# Patient Record
Sex: Male | Born: 1985 | Hispanic: No | Marital: Single | Smoking: Never smoker
Health system: Southern US, Community
[De-identification: ages and names within clinical notes are randomized; demographics above are authoritative.]

## PROBLEM LIST (undated history)

## (undated) DIAGNOSIS — F909 Attention-deficit hyperactivity disorder, unspecified type: Secondary | ICD-10-CM

## (undated) DIAGNOSIS — F419 Anxiety disorder, unspecified: Secondary | ICD-10-CM

---

## 2008-01-18 ENCOUNTER — Ambulatory Visit: Payer: Self-pay | Admitting: *Deleted

## 2008-01-25 ENCOUNTER — Ambulatory Visit: Payer: Self-pay | Admitting: *Deleted

## 2008-01-29 ENCOUNTER — Ambulatory Visit: Payer: Self-pay | Admitting: *Deleted

## 2008-04-14 ENCOUNTER — Ambulatory Visit: Payer: Self-pay | Admitting: Pediatrics

## 2008-07-25 ENCOUNTER — Ambulatory Visit: Payer: Self-pay | Admitting: Pediatrics

## 2012-02-23 ENCOUNTER — Emergency Department (HOSPITAL_BASED_OUTPATIENT_CLINIC_OR_DEPARTMENT_OTHER)
Admission: EM | Admit: 2012-02-23 | Discharge: 2012-02-23 | Disposition: A | Discharge status: Home / Self Care | Payer: BC Managed Care – PPO | Attending: Emergency Medicine | Admitting: Emergency Medicine

## 2012-02-23 ENCOUNTER — Encounter (HOSPITAL_BASED_OUTPATIENT_CLINIC_OR_DEPARTMENT_OTHER): Payer: Self-pay | Admitting: *Deleted

## 2012-02-23 DIAGNOSIS — S0180XA Unspecified open wound of other part of head, initial encounter: Secondary | ICD-10-CM | POA: Insufficient documentation

## 2012-02-23 DIAGNOSIS — W2203XA Walked into furniture, initial encounter: Secondary | ICD-10-CM | POA: Insufficient documentation

## 2012-02-23 DIAGNOSIS — S0181XA Laceration without foreign body of other part of head, initial encounter: Secondary | ICD-10-CM

## 2012-02-23 DIAGNOSIS — F909 Attention-deficit hyperactivity disorder, unspecified type: Secondary | ICD-10-CM | POA: Insufficient documentation

## 2012-02-23 HISTORY — DX: Anxiety disorder, unspecified: F41.9

## 2012-02-23 HISTORY — DX: Attention-deficit hyperactivity disorder, unspecified type: F90.9

## 2012-02-23 NOTE — ED Notes (Signed)
Pt. States he hit his head on the nightstand. Vertical 1/2 inch laceration noted above his right eye. Appears to be superficial. No bleeding noted. Denies loc.

## 2012-02-23 NOTE — ED Notes (Signed)
Patient here with small superficial laceration to right inner eyebrow, no bleeding to same after hitting corner of night stand

## 2012-02-23 NOTE — ED Provider Notes (Signed)
History     CSN: [id number]  Arrival date & time 02/23/12  4132   First MD Initiated Contact with Patient 02/23/12 0101      Chief Complaint  Patient presents with  . Facial Laceration    (Consider location/radiation/quality/duration/timing/severity/associated sxs/prior treatment) Patient is a 26 y.o. male presenting with skin laceration. The history is provided by the patient. No language interpreter was used.  Laceration  The incident occurred less than 1 hour ago. The laceration is located on the face. Size: 9 mm. Injury mechanism: corner of a night stand. The pain is mild. The pain has been constant since onset. He reports no foreign bodies present.    Past Medical History  Diagnosis Date  . Anxiety   . ADHD (attention deficit hyperactivity disorder)     History reviewed. No pertinent past surgical history.  No family history on file.  History  Substance Use Topics  . Smoking status: Never Smoker   . Smokeless tobacco: Not on file  . Alcohol Use: No      Review of Systems  All other systems reviewed and are negative.    Allergies  Strattera  Home Medications  No current outpatient prescriptions on file.  BP 128/78  Pulse 83  Temp 98 F (36.7 C) (Oral)  Resp 18  Ht 5\' 6"  (1.676 m)  Wt 120 lb (54.432 kg)  BMI 19.37 kg/m2  SpO2 98%  Physical Exam  Constitutional: He is oriented to person, place, and time. He appears well-developed and well-nourished. No distress.  HENT:  Head: Normocephalic.    Mouth/Throat: Oropharynx is clear and moist.  Eyes: Conjunctivae are normal. Pupils are equal, round, and reactive to light.  Neck: Normal range of motion. Neck supple.  Cardiovascular: Normal rate and regular rhythm.   Pulmonary/Chest: Effort normal and breath sounds normal. He has no wheezes. He has no rales.  Abdominal: Soft. Bowel sounds are normal.  Musculoskeletal: Normal range of motion.  Neurological: He is alert and oriented to person, place,  and time.  Skin: Skin is warm and dry.  Psychiatric: He has a normal mood and affect.    ED Course  Procedures (including critical care time)  Labs Reviewed - No data to display No results found.   No diagnosis found.    MDM  Follow up with your family doctor     LACERATION REPAIR Performed by: Jasmine Awe Authorized by: Jasmine Awe Consent: Verbal consent obtained. Risks and benefits: risks, benefits and alternatives were discussed Consent given by: patient Patient identity confirmed: provided demographic data Prepped and Draped in normal sterile fashion Wound explored  Laceration Location: face  Laceration Length: .9 cm  No Foreign Bodies seen or palpated  Anesthesia: local infiltration  Skin closure:dermabond  Patient tolerance: Patient tolerated the procedure well with no immediate complications.   Jasmine Awe, MD 02/23/12 [phone]

## 2017-10-21 DIAGNOSIS — K625 Hemorrhage of anus and rectum: Secondary | ICD-10-CM | POA: Diagnosis not present

## 2017-11-07 DIAGNOSIS — Z0000 Encounter for general adult medical examination without abnormal findings: Secondary | ICD-10-CM | POA: Diagnosis not present

## 2017-11-07 DIAGNOSIS — R946 Abnormal results of thyroid function studies: Secondary | ICD-10-CM | POA: Diagnosis not present

## 2017-11-07 DIAGNOSIS — Z23 Encounter for immunization: Secondary | ICD-10-CM | POA: Diagnosis not present

## 2019-01-25 ENCOUNTER — Ambulatory Visit
Admission: RE | Admit: 2019-01-25 | Discharge: 2019-01-25 | Disposition: A | Discharge status: Home / Self Care | Payer: No Typology Code available for payment source | Source: Ambulatory Visit | Attending: Occupational Medicine | Admitting: Occupational Medicine

## 2019-01-25 ENCOUNTER — Other Ambulatory Visit: Payer: Self-pay | Admitting: Occupational Medicine

## 2019-01-25 DIAGNOSIS — M25531 Pain in right wrist: Secondary | ICD-10-CM

## 2020-02-14 IMAGING — CR RIGHT WRIST - COMPLETE 3+ VIEW
4 series · 4 of 4 positions shown · non-contrast
Comparison: None.

CLINICAL DATA: Right wrist pain for 1 month.  No known injury.

EXAM:
RIGHT WRIST - COMPLETE 3+ VIEW

[x wrist pa right]
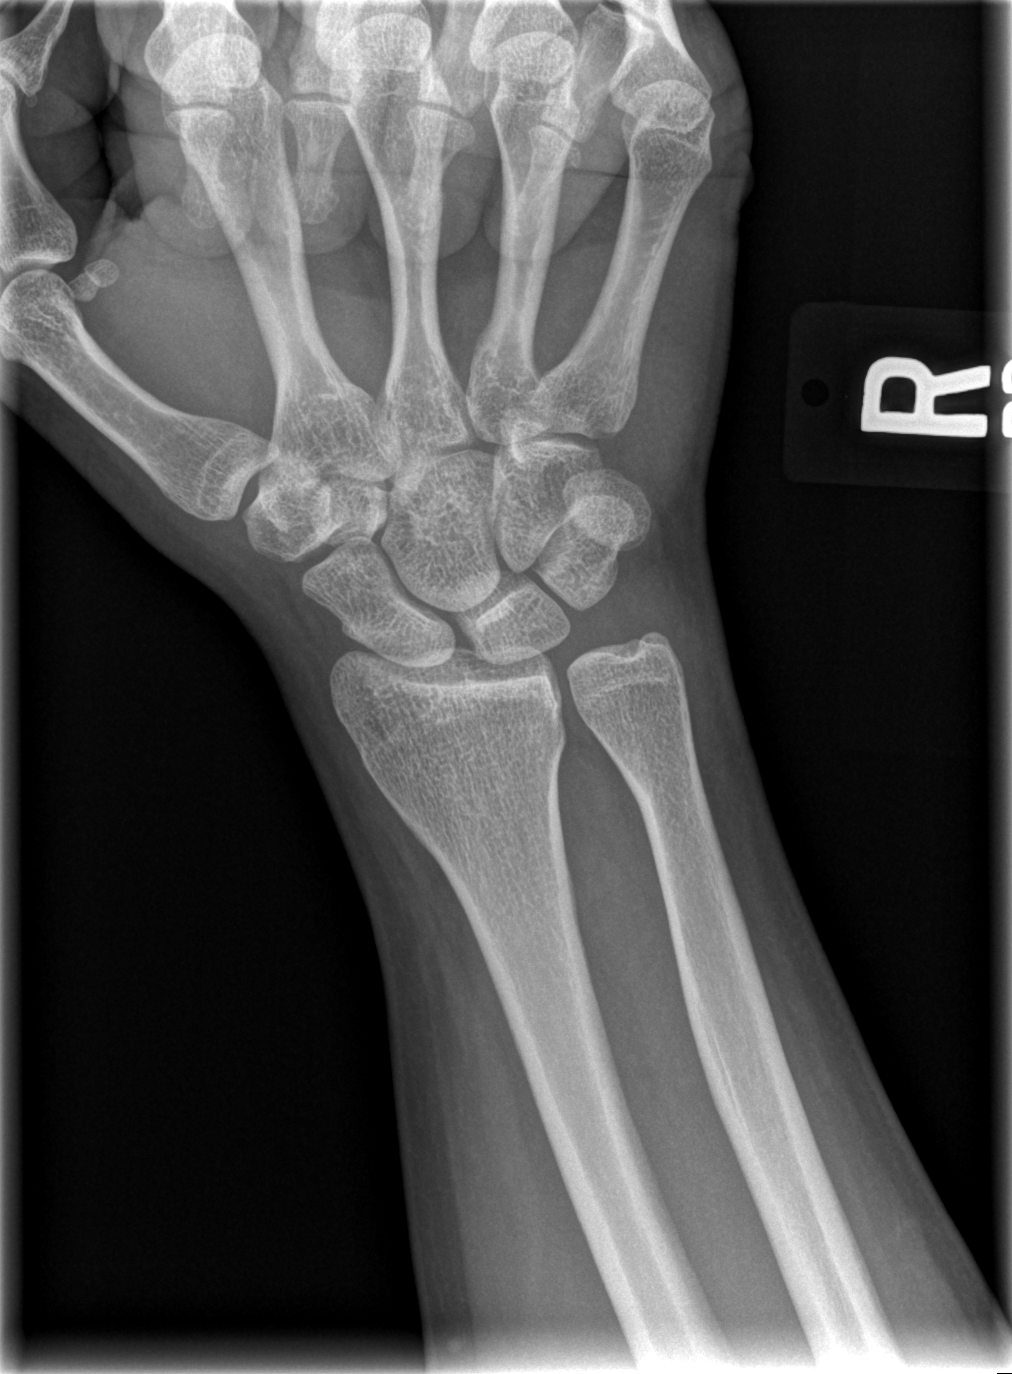

[x wrist obl right]
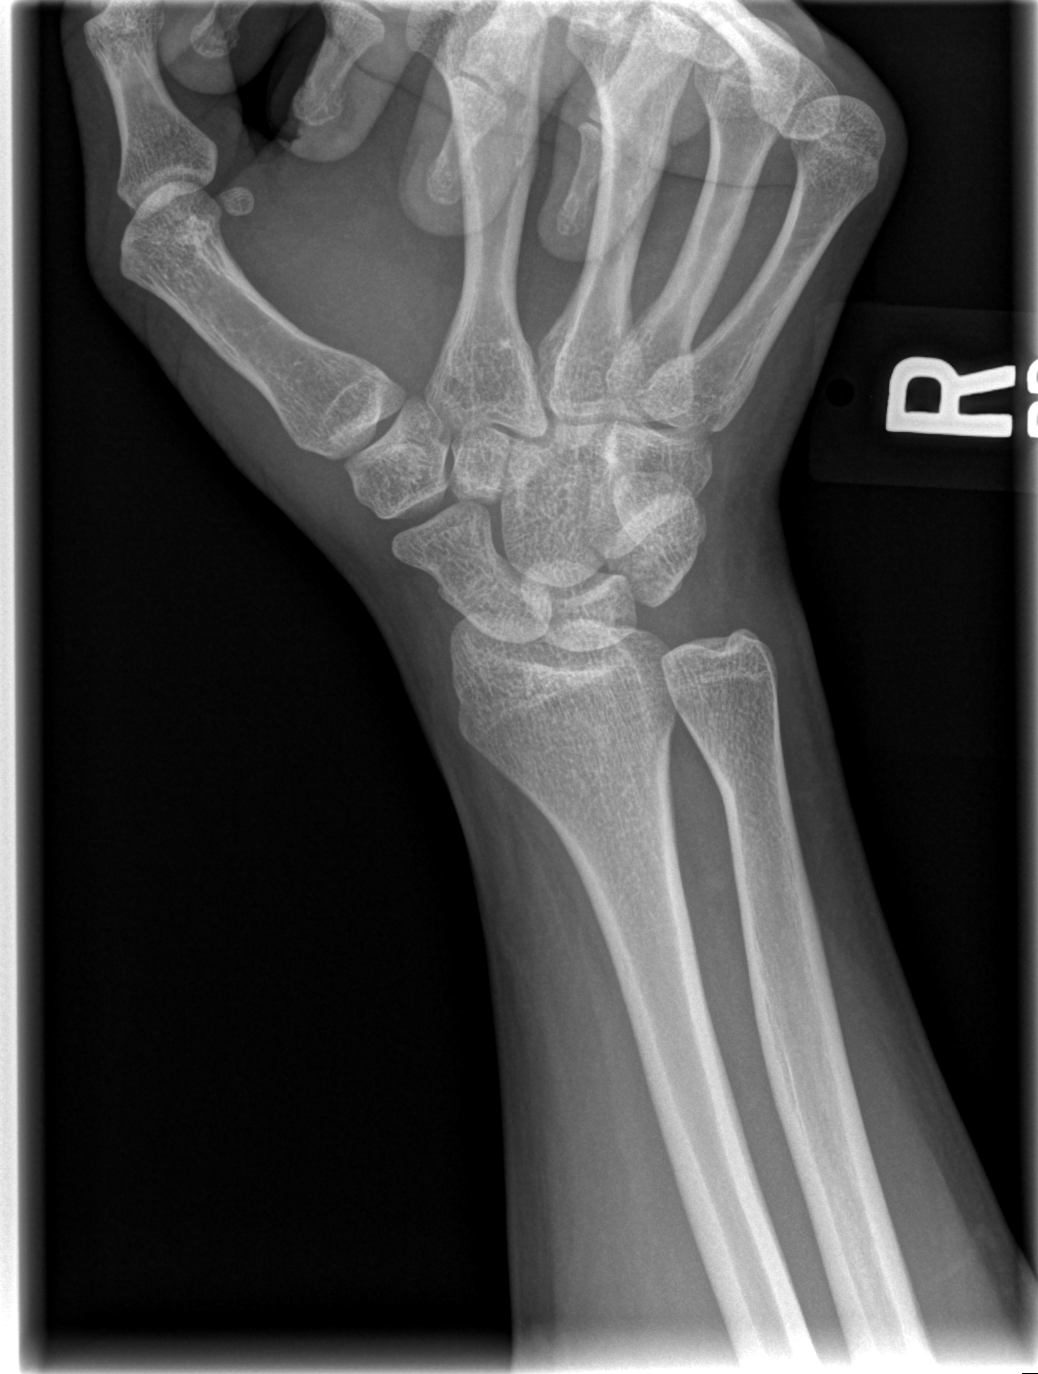

[x wrist lat right]
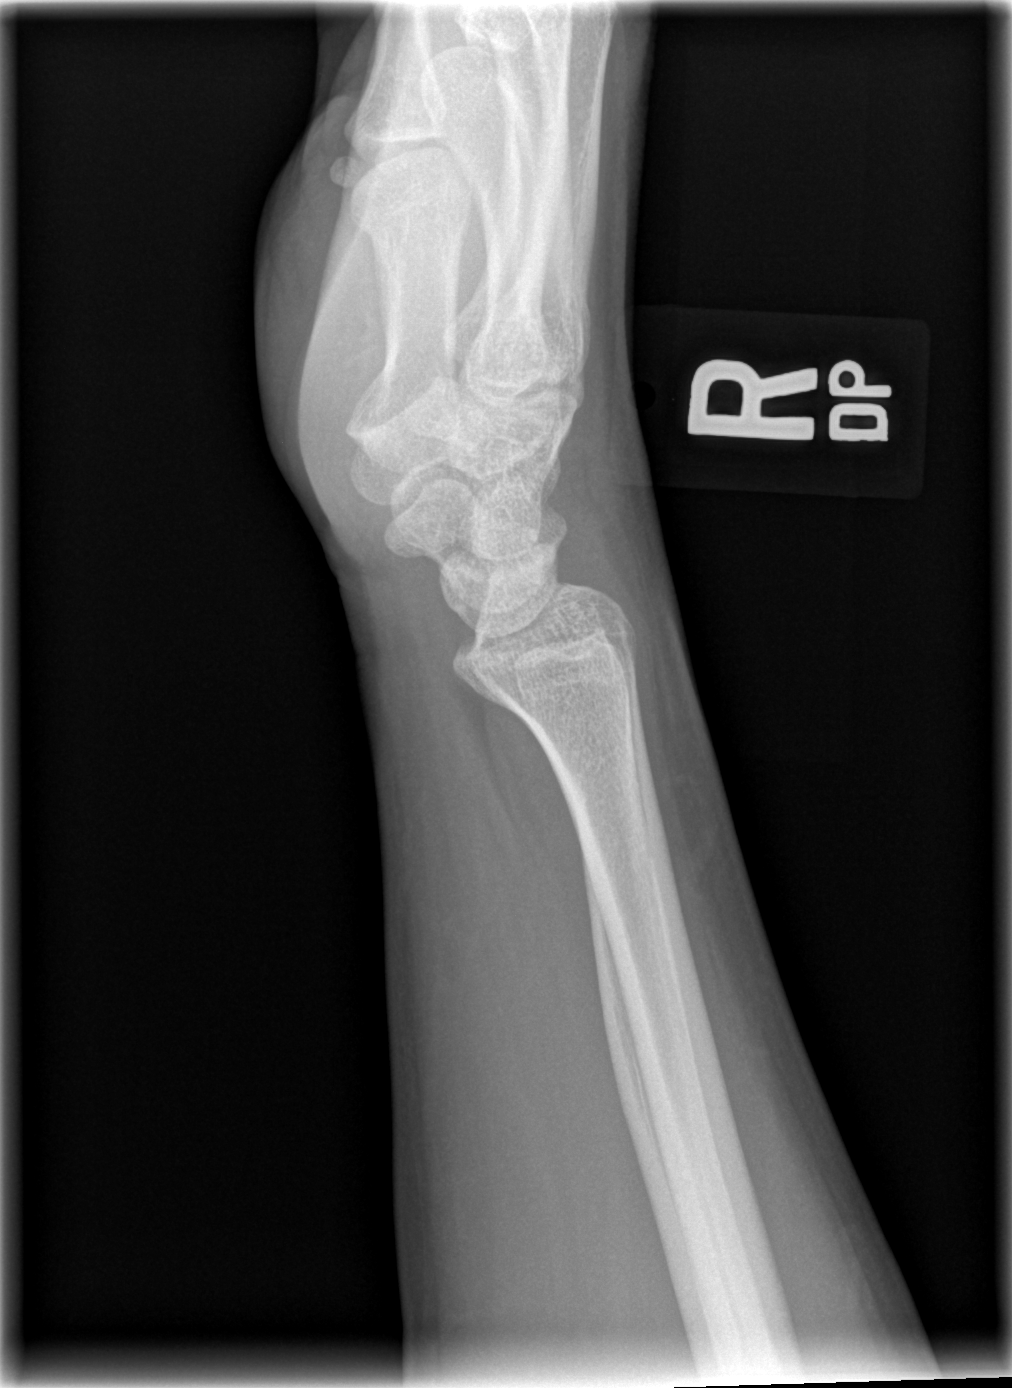

[x navicular]
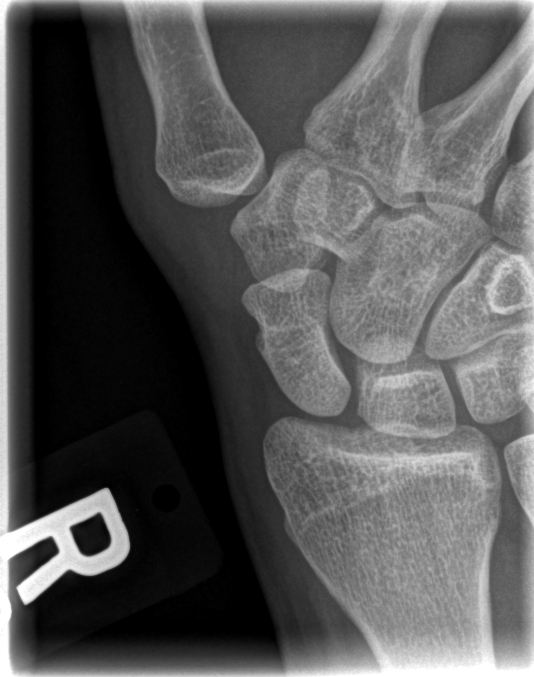

[4 of 4 positions shown; findings below may reference images not displayed]

FINDINGS: There is no evidence of fracture or dislocation. There is no
evidence of arthropathy or other focal bone abnormality. Soft
tissues are unremarkable.
IMPRESSION: Negative.

## 2022-08-07 NOTE — Progress Notes (Unsigned)
New Patient Note  RE: Hunter Fischer MRN: [id number] DOB: Mar 26, 1986 Date of Office Visit: 08/08/2022  Consult requested by: Jamie Kato Primary care provider: Patient, No Pcp Per  Chief Complaint: No chief complaint on file.  History of Present Illness: I had the pleasure of seeing Hunter Fischer for initial evaluation at the [facility] on 08/07/2022. He is a 37 y.o. male, who is referred here by Patient, No Pcp Per for the evaluation of allergic rhinitis.  He reports symptoms of ***. Symptoms have been going on for *** years. The symptoms are present *** all year around with worsening in ***. Other triggers include exposure to ***. Anosmia: ***. Headache: ***. He has used *** with ***fair improvement in symptoms. Sinus infections: ***. Previous work up includes: ***. Previous ENT evaluation: ***. Previous sinus imaging: ***. History of nasal polyps: ***. Last eye exam: ***. History of reflux: ***.  Assessment and Plan: Hunter Fischer is a 37 y.o. male with: No problem-specific Assessment & Plan notes found for this encounter.  No follow-ups on file.  No orders of the defined types were placed in this encounter.  Lab Orders  No laboratory test(s) ordered today    Other allergy screening: Asthma: {Blank single:19197::"yes","no"} Rhino conjunctivitis: {Blank single:19197::"yes","no"} Food allergy: {Blank single:19197::"yes","no"} Medication allergy: {Blank single:19197::"yes","no"} Hymenoptera allergy: {Blank single:19197::"yes","no"} Urticaria: {Blank single:19197::"yes","no"} Eczema:{Blank single:19197::"yes","no"} History of recurrent infections suggestive of immunodeficency: {Blank single:19197::"yes","no"}  Diagnostics: Spirometry:  Tracings reviewed. His effort: {Blank single:19197::"Good reproducible efforts.","It was hard to get consistent efforts and there is a question as to whether this reflects a maximal maneuver.","Poor effort,  data can not be interpreted."} FVC: ***L FEV1: ***L, ***% predicted FEV1/FVC ratio: ***% Interpretation: {Blank single:19197::"Spirometry consistent with mild obstructive disease","Spirometry consistent with moderate obstructive disease","Spirometry consistent with severe obstructive disease","Spirometry consistent with possible restrictive disease","Spirometry consistent with mixed obstructive and restrictive disease","Spirometry uninterpretable due to technique","Spirometry consistent with normal pattern","No overt abnormalities noted given today's efforts"}.  Please see scanned spirometry results for details.  Skin Testing: {Blank single:19197::"Select foods","Environmental allergy panel","Environmental allergy panel and select foods","Food allergy panel","None","Deferred due to recent antihistamines use"}. *** Results discussed with patient/family.   Past Medical History: There are no problems to display for this patient.  Past Medical History:  Diagnosis Date  . ADHD (attention deficit hyperactivity disorder)   . Anxiety    Past Surgical History: No past surgical history on file. Medication List:  No current outpatient medications on file.   No current facility-administered medications for this visit.   Allergies: Allergies  Allergen Reactions  . Strattera [Atomoxetine]    Social History: Social History   Socioeconomic History  . Marital status: Single    Spouse name: Not on file  . Number of children: Not on file  . Years of education: Not on file  . Highest education level: Not on file  Occupational History  . Not on file  Tobacco Use  . Smoking status: Never  . Smokeless tobacco: Not on file  Substance and Sexual Activity  . Alcohol use: No  . Drug use: No  . Sexual activity: Not on file  Other Topics Concern  . Not on file  Social History Narrative  . Not on file   Social Determinants of Health   Financial Resource Strain: Not on file  Food  Insecurity: Not on file  Transportation Needs: Not on file  Physical Activity: Not on file  Stress: Not on file  Social Connections: Not on file  Lives in a ***. Smoking: *** Occupation: ***  Environmental HistoryFreight forwarder in the house: Estate agent in the family room: {Blank single:19197::"yes","no"} Carpet in the bedroom: {Blank single:19197::"yes","no"} Heating: {Blank single:19197::"electric","gas","heat pump"} Cooling: {Blank single:19197::"central","window","heat pump"} Pet: {Blank single:19197::"yes ***","no"}  Family History: No family history on file. Problem                               Relation Asthma                                   *** Eczema                                *** Food allergy                          *** Allergic rhino conjunctivitis     ***  Review of Systems  Constitutional:  Negative for appetite change, chills, fever and unexpected weight change.  HENT:  Negative for congestion and rhinorrhea.   Eyes:  Negative for itching.  Respiratory:  Negative for cough, chest tightness, shortness of breath and wheezing.   Cardiovascular:  Negative for chest pain.  Gastrointestinal:  Negative for abdominal pain.  Genitourinary:  Negative for difficulty urinating.  Skin:  Negative for rash.  Neurological:  Negative for headaches.   Objective: There were no vitals taken for this visit. There is no height or weight on file to calculate BMI. Physical Exam Vitals and nursing note reviewed.  Constitutional:      Appearance: Normal appearance. He is well-developed.  HENT:     Head: Normocephalic and atraumatic.     Right Ear: Tympanic membrane and external ear normal.     Left Ear: Tympanic membrane and external ear normal.     Nose: Nose normal.     Mouth/Throat:     Mouth: Mucous membranes are moist.     Pharynx: Oropharynx is clear.  Eyes:     Conjunctiva/sclera: Conjunctivae normal.  Cardiovascular:      Rate and Rhythm: Normal rate and regular rhythm.     Heart sounds: Normal heart sounds. No murmur heard.    No friction rub. No gallop.  Pulmonary:     Effort: Pulmonary effort is normal.     Breath sounds: Normal breath sounds. No wheezing, rhonchi or rales.  Musculoskeletal:     Cervical back: Neck supple.  Skin:    General: Skin is warm.     Findings: No rash.  Neurological:     Mental Status: He is alert and oriented to person, place, and time.  Psychiatric:        Behavior: Behavior normal.  The plan was reviewed with the patient/family, and all questions/concerned were addressed.  It was my pleasure to see Eymen today and participate in his care. Please feel free to contact me with any questions or concerns.  Sincerely,  Rexene Alberts, DO Allergy & Immunology  [facility] office: [place] office: [phone]

## 2022-08-08 ENCOUNTER — Encounter: Payer: Self-pay | Admitting: Allergy

## 2022-08-08 ENCOUNTER — Ambulatory Visit: Payer: 59 | Admitting: Allergy

## 2022-08-08 ENCOUNTER — Other Ambulatory Visit: Payer: Self-pay

## 2022-08-08 VITALS — BP 122/72 | HR 77 | Temp 97.6°F | Resp 20 | Ht 66.0 in | Wt 186.0 lb

## 2022-08-08 DIAGNOSIS — R053 Chronic cough: Secondary | ICD-10-CM

## 2022-08-08 DIAGNOSIS — H1013 Acute atopic conjunctivitis, bilateral: Secondary | ICD-10-CM | POA: Diagnosis not present

## 2022-08-08 DIAGNOSIS — J302 Other seasonal allergic rhinitis: Secondary | ICD-10-CM | POA: Insufficient documentation

## 2022-08-08 DIAGNOSIS — J3089 Other allergic rhinitis: Secondary | ICD-10-CM

## 2022-08-08 NOTE — Assessment & Plan Note (Signed)
Coughing x few years. No prior asthma diagnosis or inhaler use. No recent CXR. Coughing more at home but also at work - [employer]. Unable to do spirometry as [place] won't allow new patient visits and procedures on the same day.  Return for skin testing and spirometry. Monitor symptoms.

## 2022-08-08 NOTE — Assessment & Plan Note (Signed)
Perennial rhinoconjunctivitis symptoms.  Tried Zyrtec and Nasacort with some benefit.  No prior allergy/ENT evaluation.  Some reflux symptoms after drinking soda.   Unable to skin test today as [facility] won't allow new patient visits and procedures on the same day.  Monitor symptoms. Return for skin testing - no antihistamines for 3 days before. Will make additional recommendations after skin testing.

## 2022-08-08 NOTE — Patient Instructions (Signed)
Unable to skin test today as [facility] won't allow new patient visits and procedures on the same day.   Rhinitis/coughing Monitor symptoms. Return for skin testing - no antihistamines for 3 days before. Will make additional recommendations after skin testing.  Follow up for skin testing.

## 2022-08-22 ENCOUNTER — Ambulatory Visit (INDEPENDENT_AMBULATORY_CARE_PROVIDER_SITE_OTHER): Payer: 59 | Admitting: Allergy

## 2022-08-22 ENCOUNTER — Other Ambulatory Visit: Payer: Self-pay

## 2022-08-22 ENCOUNTER — Encounter: Payer: Self-pay | Admitting: Allergy

## 2022-08-22 VITALS — BP 122/74 | HR 75 | Resp 18 | Ht 66.0 in | Wt 180.8 lb

## 2022-08-22 DIAGNOSIS — J3089 Other allergic rhinitis: Secondary | ICD-10-CM | POA: Diagnosis not present

## 2022-08-22 DIAGNOSIS — R040 Epistaxis: Secondary | ICD-10-CM

## 2022-08-22 DIAGNOSIS — R053 Chronic cough: Secondary | ICD-10-CM

## 2022-08-22 NOTE — Assessment & Plan Note (Signed)
Past history - Perennial rhinoconjunctivitis symptoms.  Tried Zyrtec and Nasacort with some benefit.  No prior ENT evaluation.  Some reflux symptoms after drinking soda.   Interim history - slightly increased symptoms off antihistamines.  Today's skin testing showed: Positive to grass, ragweed, weed, mold, cockroach and dust mites. Start environmental control measures as below. Use over the counter antihistamines such as Zyrtec (cetirizine), Claritin (loratadine), Allegra (fexofenadine), or Xyzal (levocetirizine) daily as needed. May take twice a day during allergy flares. May switch antihistamines every few months. Start Ryaltris (olopatadine + mometasone nasal spray combination) 1-2 sprays per nostril twice a day. Sample given. This replaces your other nasal sprays. If this works well for you let me know and I'll send a prescription in. Otherwise you can also use Nasacort 1-2 sprays per nostril once a day.  Nasal saline spray (i.e., Simply Saline) or nasal saline lavage (i.e., NeilMed) is recommended as needed and prior to medicated nasal sprays. Consider allergy injections for long term control if above medications do not help the symptoms - handout given.  Patient's mother is on shots.  Patient only wants to follow up as needed at this time.

## 2022-08-22 NOTE — Assessment & Plan Note (Signed)
Nosebleeds are very common.  Site of the bleeding is typically on the septum or at the very front of the nose.  Some of the more common causes are from trauma, inflammation or medication induced. Pinch both nostrils while leaning forward for at least 5 minutes before checking to see if the bleeding has stopped. If bleeding is not controlled within 5-10 minutes apply a cotton ball soaked with oxymetazoline (Afrin) to the bleeding nostril for a few seconds.  Preventative treatment: Apply saline nasal gel in each nostril twice a day for 2 weeks to allow the nasal mucosa to heal Consider using a humidifier in the winter Try to keep your blood pressure as normal as possible (120/80)

## 2022-08-22 NOTE — Patient Instructions (Signed)
Today's skin testing showed: Positive to grass, ragweed, weed, mold, cockroach and dust mites. Negative to common foods.   Results given.  Environmental allergies Start environmental control measures as below. Use over the counter antihistamines such as Zyrtec (cetirizine), Claritin (loratadine), Allegra (fexofenadine), or Xyzal (levocetirizine) daily as needed. May take twice a day during allergy flares. May switch antihistamines every few months. Start Ryaltris (olopatadine + mometasone nasal spray combination) 1-2 sprays per nostril twice a day. Sample given. This replaces your other nasal sprays. If this works well for you let me know and I'll send a prescription in. Otherwise you can also use Nasacort 1-2 sprays per nostril once a day.  Nasal saline spray (i.e., Simply Saline) or nasal saline lavage (i.e., NeilMed) is recommended as needed and prior to medicated nasal sprays. Consider allergy injections for long term control if above medications do not help the symptoms - handout given.   Coughing Normal breathing test today.  Monitor symptoms.  Nose Bleeds: Nosebleeds are very common.  Site of the bleeding is typically on the septum or at the very front of the nose.  Some of the more common causes are from trauma, inflammation or medication induced. Pinch both nostrils while leaning forward for at least 5 minutes before checking to see if the bleeding has stopped. If bleeding is not controlled within 5-10 minutes apply a cotton ball soaked with oxymetazoline (Afrin) to the bleeding nostril for a few seconds.  Preventative treatment: Apply saline nasal gel in each nostril twice a day for 2 weeks to allow the nasal mucosa to heal Consider using a humidifier in the winter Try to keep your blood pressure as normal as possible (120/80)  Follow up if needed.   Reducing Pollen Exposure Pollen seasons: trees (spring), grass (summer) and ragweed/weeds (fall). Keep windows closed in your  home and car to lower pollen exposure.  Install air conditioning in the bedroom and throughout the house if possible.  Avoid going out in dry windy days - especially early morning. Pollen counts are highest between 5 - 10 AM and on dry, hot and windy days.  Save outside activities for late afternoon or after a heavy rain, when pollen levels are lower.  Avoid mowing of grass if you have grass pollen allergy. Be aware that pollen can also be transported indoors on people and pets.  Dry your clothes in an automatic dryer rather than hanging them outside where they might collect pollen.  Rinse hair and eyes before bedtime. Control of House Dust Mite Allergen Dust mite allergens are a common trigger of allergy and asthma symptoms. While they can be found throughout the house, these microscopic creatures thrive in warm, humid environments such as bedding, upholstered furniture and carpeting. Because so much time is spent in the bedroom, it is essential to reduce mite levels there.  Encase pillows, mattresses, and box springs in special allergen-proof fabric covers or airtight, zippered plastic covers.  Bedding should be washed weekly in hot water (130 F) and dried in a hot dryer. Allergen-proof covers are available for comforters and pillows that can't be regularly washed.  Wash the allergy-proof covers every few months. Minimize clutter in the bedroom. Keep pets out of the bedroom.  Keep humidity less than 50% by using a dehumidifier or air conditioning. You can buy a humidity measuring device called a hygrometer to monitor this.  If possible, replace carpets with hardwood, linoleum, or washable area rugs. If that's not possible, vacuum frequently with a vacuum  that has a HEPA filter. Remove all upholstered furniture and non-washable window drapes from the bedroom. Remove all non-washable stuffed toys from the bedroom.  Wash stuffed toys weekly. Cockroach Allergen Avoidance Cockroaches are often  found in the homes of densely populated urban areas, schools or commercial buildings, but these creatures can lurk almost anywhere. This does not mean that you have a dirty house or living area. Block all areas where roaches can enter the home. This includes crevices, wall cracks and windows.  Cockroaches need water to survive, so fix and seal all leaky faucets and pipes. Have an exterminator go through the house when your family and pets are gone to eliminate any remaining roaches. Keep food in lidded containers and put pet food dishes away after your pets are done eating. Vacuum and sweep the floor after meals, and take out garbage and recyclables. Use lidded garbage containers in the kitchen. Wash dishes immediately after use and clean under stoves, refrigerators or toasters where crumbs can accumulate. Wipe off the stove and other kitchen surfaces and cupboards regularly. Mold Control Mold and fungi can grow on a variety of surfaces provided certain temperature and moisture conditions exist.  Outdoor molds grow on plants, decaying vegetation and soil. The major outdoor mold, Alternaria and Cladosporium, are found in very high numbers during hot and dry conditions. Generally, a late summer - fall peak is seen for common outdoor fungal spores. Rain will temporarily lower outdoor mold spore count, but counts rise rapidly when the rainy period ends. The most important indoor molds are Aspergillus and Penicillium. Dark, humid and poorly ventilated basements are ideal sites for mold growth. The next most common sites of mold growth are the bathroom and the kitchen. Outdoor (Seasonal) Mold Control Use air conditioning and keep windows closed. Avoid exposure to decaying vegetation. Avoid leaf raking. Avoid grain handling. Consider wearing a face mask if working in moldy areas.  Indoor (Perennial) Mold Control  Maintain humidity below 50%. Get rid of mold growth on hard surfaces with water, detergent and,  if necessary, 5% bleach (do not mix with other cleaners). Then dry the area completely. If mold covers an area more than 10 square feet, consider hiring an indoor environmental professional. For clothing, washing with soap and water is best. If moldy items cannot be cleaned and dried, throw them away. Remove sources e.g. contaminated carpets. Repair and seal leaking roofs or pipes. Using dehumidifiers in damp basements may be helpful, but empty the water and clean units regularly to prevent mildew from forming. All rooms, especially basements, bathrooms and kitchens, require ventilation and cleaning to deter mold and mildew growth. Avoid carpeting on concrete or damp floors, and storing items in damp areas.

## 2022-08-22 NOTE — Progress Notes (Signed)
Follow Up Note  RE: Hunter Fischer MRN: [id number] DOB: 03-11-1986 Date of Office Visit: 08/22/2022  Referring provider: No ref. provider found Primary care provider: [place], [place]  Chief Complaint: Allergy Testing (Monday morning last time he took his allergy medication )  History of Present Illness: I had the pleasure of seeing Hunter Fischer for a follow up visit at the [facility] [place] on 08/22/2022. He is a 37 y.o. male, who is being followed for allergic rhinitis and coughing. His previous allergy office visit was on 08/08/2022 with Dr. Maudie Mercury. Today is a skin testing and follow up visit.  Symptoms slightly worse since off antihistamines.  Mentions nosebleeds at times as well with the cold dry air.   Assessment and Plan: Hunter Fischer is a 37 y.o. male with: Other allergic rhinitis Past history - Perennial rhinoconjunctivitis symptoms.  Tried Zyrtec and Nasacort with some benefit.  No prior ENT evaluation.  Some reflux symptoms after drinking soda.   Interim history - slightly increased symptoms off antihistamines.  Today's skin testing showed: Positive to grass, ragweed, weed, mold, cockroach and dust mites. Start environmental control measures as below. Use over the counter antihistamines such as Zyrtec (cetirizine), Claritin (loratadine), Allegra (fexofenadine), or Xyzal (levocetirizine) daily as needed. May take twice a day during allergy flares. May switch antihistamines every few months. Start Ryaltris (olopatadine + mometasone nasal spray combination) 1-2 sprays per nostril twice a day. Sample given. This replaces your other nasal sprays. If this works well for you let me know and I'll send a prescription in. Otherwise you can also use Nasacort 1-2 sprays per nostril once a day.  Nasal saline spray (i.e., Simply Saline) or nasal saline lavage (i.e., NeilMed) is recommended as needed and prior to medicated nasal sprays. Consider allergy  injections for long term control if above medications do not help the symptoms - handout given.  Patient's mother is on shots.  Patient only wants to follow up as needed at this time.   Chronic coughing Past history - Coughing x few years. No prior asthma diagnosis or inhaler use. No recent CXR. Coughing more at home but also at work - [employer]. Interim history - unchanged. Today's skin testing showed: Positive to grass, ragweed, weed, mold, cockroach and dust mites. Negative to common foods.  Normal spirometry today.  Monitor symptoms - most likely due to PND/environmental allergy irritation.  No indication for inhaler use at this time.  Epistaxis Nosebleeds are very common.  Site of the bleeding is typically on the septum or at the very front of the nose.  Some of the more common causes are from trauma, inflammation or medication induced. Pinch both nostrils while leaning forward for at least 5 minutes before checking to see if the bleeding has stopped. If bleeding is not controlled within 5-10 minutes apply a cotton ball soaked with oxymetazoline (Afrin) to the bleeding nostril for a few seconds.  Preventative treatment: Apply saline nasal gel in each nostril twice a day for 2 weeks to allow the nasal mucosa to heal Consider using a humidifier in the winter Try to keep your blood pressure as normal as possible (120/80)  Return if symptoms worsen or fail to improve.  No orders of the defined types were placed in this encounter.  Lab Orders  No laboratory test(s) ordered today    Diagnostics: Spirometry:  Hunter Fischer: Good reproducible efforts. FVC: 5.29L FEV1: 4.65L, 142% predicted FEV1/FVC ratio: 88%  Interpretation: Spirometry consistent with normal pattern.  Please see scanned spirometry results for details.  Skin Testing: Environmental allergy panel and select foods. Positive to grass, ragweed, weed, mold, cockroach and dust mites. Negative to  common foods.  Results discussed with patient/family.  Airborne Adult Perc - 08/22/22 0847     Time Antigen Placed 0847    Allergen Manufacturer Lavella Hammock    Location Back    Number of Test 59    1. Control-Buffer 50% Glycerol Negative    2. Control-Histamine 1 mg/ml 3+    3. Albumin saline Negative    4. [place] Negative    5. [place] --   +/-   6. Johnson --   +/-   7. Kentucky Blue 3+    8. Meadow Fescue 2+    9. Perennial Rye 3+    10. Sweet Vernal 2+    11. Timothy 3+    12. Cocklebur Negative    13. Burweed Marshelder Negative    14. Ragweed, short 4+    15. Ragweed, Giant Negative    16. Plantain,  English Negative    17. Lamb's Quarters Negative    18. Sheep Sorrell Negative    19. Rough Pigweed Negative    20. Marsh Elder, Rough Negative    21. Mugwort, Common Negative    22. Ash mix Negative    23. Birch mix Negative    24. Beech American Negative    25. Box, Elder Negative    26. Cedar, red Negative    27. Cottonwood, [place] Negative    28. Elm mix Negative    29. Hickory Negative    30. Maple mix Negative    31. Oak, [place] mix Negative    32. Pecan Pollen Negative    33. Pine mix Negative    34. Sycamore Eastern Negative    35. [place], Black Pollen Negative    36. Alternaria alternata Negative    37. Cladosporium Herbarum Negative    38. Aspergillus mix Negative    39. Penicillium mix Negative    40. Bipolaris sorokiniana (Helminthosporium) Negative    41. Drechslera spicifera (Curvularia) Negative    42. Mucor plumbeus Negative    43. Fusarium moniliforme Negative    44. Aureobasidium pullulans (pullulara) Negative    45. Rhizopus oryzae Negative    46. Botrytis cinera Negative    47. Epicoccum nigrum Negative    48. Phoma betae Negative    49. Candida Albicans Negative    50. Trichophyton mentagrophytes Negative    51. Mite, D Farinae  5,000 AU/ml Negative    52. Mite, D Pteronyssinus  5,000 AU/ml Negative    53. Cat Hair 10,000 BAU/ml Negative     54.  Dog Epithelia Negative    55. Mixed Feathers Negative    56. Horse Epithelia Negative    57. Cockroach, German Negative    58. Mouse Negative    59. Tobacco Leaf Negative             Food Perc - 08/22/22 0847       Test Information   Time Antigen Placed [id number]    Allergen Manufacturer Lavella Hammock    Location Back    Number of allergen test 10      Food   1. Peanut Negative    2. Soybean food Negative    3. Wheat, whole Negative    4. Sesame Negative    5. Milk, cow Negative    6. Egg  White, chicken Negative    7. Casein Negative    8. Shellfish mix Negative    9. Fish mix Negative    10. Cashew Negative             Intradermal - 08/22/22 0938     Time Antigen Placed [id number]    Allergen Manufacturer Lavella Hammock    Location Arm    Number of Test 10    Control Negative    Weed mix 3+    Tree mix Negative    Mold 2 Negative    Mold 3 Negative    Mold 4 Negative    Cat Negative    Dog Negative    Cockroach 2+    Mite mix 3+             Medication List:  Current Outpatient Medications  Medication Sig Dispense Refill   ADDERALL 10 MG tablet 1 tablet Orally Twice a day for 30 days     No current facility-administered medications for this visit.   Allergies: Allergies  Allergen Reactions   Strattera [Atomoxetine]    I reviewed his past medical history, social history, family history, and environmental history and no significant changes have been reported from his previous visit.  Review of Systems  Constitutional:  Negative for appetite change, chills, fever and unexpected weight change.  HENT:  Positive for congestion, postnasal drip and rhinorrhea.   Eyes:  Negative for itching.  Respiratory:  Positive for cough. Negative for chest tightness, shortness of breath and wheezing.   Cardiovascular:  Negative for chest pain.  Gastrointestinal:  Negative for abdominal pain.  Genitourinary:  Negative for difficulty urinating.  Skin:  Negative for rash.   Allergic/Immunologic: Positive for environmental allergies. Negative for food allergies.  Neurological:  Negative for headaches.    Objective: BP 122/74   Pulse 75   Resp 18   Ht '5\' 6"'$  (1.676 m)   Wt 180 lb 12.8 oz (82 kg)   SpO2 96%   BMI 29.18 kg/m  Body mass index is 29.18 kg/m. Physical Exam Vitals and nursing note reviewed.  Constitutional:      Appearance: Normal appearance. He is well-developed.  HENT:     Head: Normocephalic and atraumatic.     Right Ear: Tympanic membrane and external ear normal.     Left Ear: Tympanic membrane and external ear normal.     Nose: Nose normal.     Mouth/Throat:     Mouth: Mucous membranes are moist.     Pharynx: Oropharynx is clear.  Eyes:     Conjunctiva/sclera: Conjunctivae normal.  Cardiovascular:     Rate and Rhythm: Normal rate and regular rhythm.     Heart sounds: Normal heart sounds. No murmur heard.    No friction rub. No gallop.  Pulmonary:     Fischer: Pulmonary Fischer is normal.     Breath sounds: Normal breath sounds. No wheezing, rhonchi or rales.  Musculoskeletal:     Cervical back: Neck supple.  Skin:    General: Skin is warm.     Findings: No rash.  Neurological:     Mental Status: He is alert and oriented to person, place, and time.   Previous notes and tests were reviewed. The plan was reviewed with the patient/family, and all questions/concerned were addressed.  It was my pleasure to see Geordie today and participate in his care. Please feel free to contact me with any questions or concerns.  Sincerely,  Rexene Alberts, DO  Allergy & Immunology  [facility] office: [place] office: [phone]

## 2022-08-22 NOTE — Assessment & Plan Note (Signed)
Past history - Coughing x few years. No prior asthma diagnosis or inhaler use. No recent CXR. Coughing more at home but also at work - [employer]. Interim history - unchanged. Today's skin testing showed: Positive to grass, ragweed, weed, mold, cockroach and dust mites. Negative to common foods.  Normal spirometry today.  Monitor symptoms - most likely due to PND/environmental allergy irritation.  No indication for inhaler use at this time.

## 2023-08-11 NOTE — Progress Notes (Unsigned)
Follow Up Note  RE: Hunter Fischer MRN: [id number] DOB: [birth year] Date of Office Visit: 08/12/2023  Referring provider: [facility] Primary care provider: [facility] Complaint: No chief complaint on file.  History of Present Illness: I had the pleasure of seeing Hunter Fischer for a follow up visit at the [facility] on 08/11/2023. He is a 38 y.o. male, who is being followed for allergic rhinitis, coughing, epistaxis. His previous allergy office visit was on 08/22/2022 with Dr. Selena Batten. Today is a regular follow up visit.  Discussed the use of AI scribe software for clinical note transcription with the patient, who gave verbal consent to proceed.  History of Present Illness            ***  Assessment and Plan: Hunter Fischer is a 38 y.o. male with: Seasonal and perennial allergic rhinitis Past history - 2024 skin testing positive to grass, ragweed, weed, mold, cockroach and dust mites. No prior ENT evaluation.   Interim history - slightly increased symptoms off antihistamines.  Start environmental control measures as below. Use over the counter antihistamines such as Zyrtec (cetirizine), Claritin (loratadine), Allegra (fexofenadine), or Xyzal (levocetirizine) daily as needed. May take twice a day during allergy flares. May switch antihistamines every few months. Start Ryaltris (olopatadine + mometasone nasal spray combination) 1-2 sprays per nostril twice a day. Sample given. This replaces your other nasal sprays. If this works well for you let me know and I'll send a prescription in. Otherwise you can also use Nasacort 1-2 sprays per nostril once a day.  Nasal saline spray (i.e., Simply Saline) or nasal saline lavage (i.e., NeilMed) is recommended as needed and prior to medicated nasal sprays. Consider allergy injections for long term control if above medications do not help the symptoms - handout given.  Patient's mother  is on shots.  Patient only wants to follow up as needed at this time.    Chronic coughing Past history - Coughing x few years. No prior asthma diagnosis or inhaler use. No recent CXR. Coughing more at home but also at work - [employer]. Interim history - unchanged. Today's skin testing showed: Positive to grass, ragweed, weed, mold, cockroach and dust mites. Negative to common foods.  Normal spirometry today.  Monitor symptoms - most likely due to PND/environmental allergy irritation.  No indication for inhaler use at this time.  Assessment and Plan              No follow-ups on file.  No orders of the defined types were placed in this encounter.  Lab Orders  No laboratory test(s) ordered today    Diagnostics: Spirometry:  Tracings reviewed. His effort: {Blank single:19197::"Good reproducible efforts.","It was hard to get consistent efforts and there is a question as to whether this reflects a maximal maneuver.","Poor effort, data can not be interpreted."} FVC: ***L FEV1: ***L, ***% predicted FEV1/FVC ratio: ***% Interpretation: {Blank single:19197::"Spirometry consistent with mild obstructive disease","Spirometry consistent with moderate obstructive disease","Spirometry consistent with severe obstructive disease","Spirometry consistent with possible restrictive disease","Spirometry consistent with mixed obstructive and restrictive disease","Spirometry uninterpretable due to technique","Spirometry consistent with normal pattern","No overt abnormalities noted given today's efforts"}.  Please see scanned spirometry results for details.  Skin Testing: {Blank single:19197::"Select foods","Environmental allergy panel","Environmental allergy panel and select foods","Food allergy panel","None","Deferred due to recent antihistamines use"}. *** Results discussed with patient/family.   Medication List:  Current Outpatient Medications  Medication Sig Dispense Refill  . ADDERALL  10 MG tablet 1 tablet Orally Twice a day for 30 days     No current facility-administered medications for this visit.   Allergies: Allergies  Allergen Reactions  . Strattera [Atomoxetine]    I reviewed his past medical history, social history, family history, and environmental history and no significant changes have been reported from his previous visit.  Review of Systems  Constitutional:  Negative for appetite change, chills, fever and unexpected weight change.  HENT:  Positive for congestion, postnasal drip and rhinorrhea.   Eyes:  Negative for itching.  Respiratory:  Positive for cough. Negative for chest tightness, shortness of breath and wheezing.   Cardiovascular:  Negative for chest pain.  Gastrointestinal:  Negative for abdominal pain.  Genitourinary:  Negative for difficulty urinating.  Skin:  Negative for rash.  Allergic/Immunologic: Positive for environmental allergies. Negative for food allergies.  Neurological:  Negative for headaches.   Objective: There were no vitals taken for this visit. There is no height or weight on file to calculate BMI. Physical Exam Vitals and nursing note reviewed.  Constitutional:      Appearance: Normal appearance. He is well-developed.  HENT:     Head: Normocephalic and atraumatic.     Right Ear: Tympanic membrane and external ear normal.     Left Ear: Tympanic membrane and external ear normal.     Nose: Nose normal.     Mouth/Throat:     Mouth: Mucous membranes are moist.     Pharynx: Oropharynx is clear.  Eyes:     Conjunctiva/sclera: Conjunctivae normal.  Cardiovascular:     Rate and Rhythm: Normal rate and regular rhythm.     Heart sounds: Normal heart sounds. No murmur heard.    No friction rub. No gallop.  Pulmonary:     Effort: Pulmonary effort is normal.     Breath sounds: Normal breath sounds. No wheezing, rhonchi or rales.  Musculoskeletal:     Cervical back: Neck supple.  Skin:    General: Skin is warm.      Findings: No rash.  Neurological:     Mental Status: He is alert and oriented to person, place, and time.  Previous notes and tests were reviewed. The plan was reviewed with the patient/family, and all questions/concerned were addressed.  It was my pleasure to see Hunter Fischer today and participate in his care. Please feel free to contact me with any questions or concerns.  Sincerely,  Wyline Mood, DO Allergy & Immunology  [facility] office: [phone] [facility] office: [phone]

## 2023-08-12 ENCOUNTER — Encounter: Payer: Self-pay | Admitting: Allergy

## 2023-08-12 ENCOUNTER — Telehealth: Payer: Self-pay

## 2023-08-12 ENCOUNTER — Ambulatory Visit (INDEPENDENT_AMBULATORY_CARE_PROVIDER_SITE_OTHER): Payer: 59 | Admitting: Allergy

## 2023-08-12 VITALS — BP 118/78 | HR 69 | Temp 98.0°F | Resp 18 | Ht 67.0 in | Wt 186.5 lb

## 2023-08-12 DIAGNOSIS — J302 Other seasonal allergic rhinitis: Secondary | ICD-10-CM | POA: Diagnosis not present

## 2023-08-12 DIAGNOSIS — J3089 Other allergic rhinitis: Secondary | ICD-10-CM | POA: Diagnosis not present

## 2023-08-12 DIAGNOSIS — R053 Chronic cough: Secondary | ICD-10-CM

## 2023-08-12 MED ORDER — RYALTRIS 665-25 MCG/ACT NA SUSP: 1.0000 | Freq: Two times a day (BID) | NASAL | 5 refills | Status: DC

## 2023-08-12 MED ORDER — LEVOCETIRIZINE DIHYDROCHLORIDE 5 MG PO TABS: 5.0000 mg | ORAL_TABLET | Freq: Every evening | ORAL | 5 refills | Status: DC

## 2023-08-12 NOTE — Telephone Encounter (Signed)
Received a fax stating that the Ryaltris Nasal Spray is needing a PA. Patient has tried Nasacort before. He has previously been given a sample of Ryaltris in which he found effective. Please initiate PA.  Cover my meds Key: [id number]

## 2023-08-12 NOTE — Patient Instructions (Addendum)
Environmental allergies 2024 skin testing positive to grass, ragweed, weed, mold, cockroach and dust mites. Continue environmental control measures as below. Take Xyzal (levocetirizine) daily at night.  Start Ryaltris (olopatadine + mometasone nasal spray combination) 1-2 sprays per nostril twice a day. Sample given. This replaces your other nasal sprays. If this works well for you, then have pharmacy ship the medication to your home - prescription already sent in.  Nasal saline spray (i.e., Simply Saline) or nasal saline lavage (i.e., NeilMed) is recommended as needed and prior to medicated nasal sprays. Try saline lavage after coming home from work.  Consider allergy injections for long term control if above medications do not help the symptoms - handout given.  2 injections.   Coughing Monitor symptoms.  Follow up in 2 months or sooner if needed.   Buffered Isotonic Saline Irrigations:  Goal: When you irrigate with the isotonic saline (salt water) it washes mucous and other debris from your nose that could be contributing to your nasal symptoms.   Recipe: Obtain 1 quart jar that is clean Fill with clean (bottled, boiled or distilled) water Add 1-2 heaping teaspoons of salt without iodine If the solution with 2 teaspoons of salt is too strong, adjust the amount down until better tolerated Add 1 teaspoon of Arm [employer] Hammer baking soda (pure bicarbonate) Mix ingredients together and store at room temperature and discard after 1 week * Alternatively you can buy pre made salt packets for the NeilMed bottle or there          are other over the counter brands available  Instructions: Warm  cup of the solution in the microwave if desired but be careful not to overheat as this will burn the inside of your nose Stand over a sink (or do it while you shower) and squirt the solution into one side of your nose aiming towards the back of your head Sometimes saying "coca cola" while irrigating  can be helpful to prevent fluid from going down your throat  The solution will travel to the back of your nose and then come out the other side Perform this again on the other side Try to do this twice a day If you are using a nasal spray in addition to the irrigation, irrigate first and then use the topical nasal spray otherwise you will wash the nasal spray out of your nose   Reducing Pollen Exposure Pollen seasons: trees (spring), grass (summer) and ragweed/weeds (fall). Keep windows closed in your home and car to lower pollen exposure.  Install air conditioning in the bedroom and throughout the house if possible.  Avoid going out in dry windy days - especially early morning. Pollen counts are highest between 5 - 10 AM and on dry, hot and windy days.  Save outside activities for late afternoon or after a heavy rain, when pollen levels are lower.  Avoid mowing of grass if you have grass pollen allergy. Be aware that pollen can also be transported indoors on people and pets.  Dry your clothes in an automatic dryer rather than hanging them outside where they might collect pollen.  Rinse hair and eyes before bedtime. Control of House Dust Mite Allergen Dust mite allergens are a common trigger of allergy and asthma symptoms. While they can be found throughout the house, these microscopic creatures thrive in warm, humid environments such as bedding, upholstered furniture and carpeting. Because so much time is spent in the bedroom, it is essential to reduce mite levels there.  Encase pillows, mattresses, and box springs in special allergen-proof fabric covers or airtight, zippered plastic covers.  Bedding should be washed weekly in hot water (130 F) and dried in a hot dryer. Allergen-proof covers are available for comforters and pillows that can't be regularly washed.  Wash the allergy-proof covers every few months. Minimize clutter in the bedroom. Keep pets out of the bedroom.  Keep humidity  less than 50% by using a dehumidifier or air conditioning. You can buy a humidity measuring device called a hygrometer to monitor this.  If possible, replace carpets with hardwood, linoleum, or washable area rugs. If that's not possible, vacuum frequently with a vacuum that has a HEPA filter. Remove all upholstered furniture and non-washable window drapes from the bedroom. Remove all non-washable stuffed toys from the bedroom.  Wash stuffed toys weekly. Cockroach Allergen Avoidance Cockroaches are often found in the homes of densely populated urban areas, schools or commercial buildings, but these creatures can lurk almost anywhere. This does not mean that you have a dirty house or living area. Block all areas where roaches can enter the home. This includes crevices, wall cracks and windows.  Cockroaches need water to survive, so fix and seal all leaky faucets and pipes. Have an exterminator go through the house when your family and pets are gone to eliminate any remaining roaches. Keep food in lidded containers and put pet food dishes away after your pets are done eating. Vacuum and sweep the floor after meals, and take out garbage and recyclables. Use lidded garbage containers in the kitchen. Wash dishes immediately after use and clean under stoves, refrigerators or toasters where crumbs can accumulate. Wipe off the stove and other kitchen surfaces and cupboards regularly. Mold Control Mold and fungi can grow on a variety of surfaces provided certain temperature and moisture conditions exist.  Outdoor molds grow on plants, decaying vegetation and soil. The major outdoor mold, Alternaria and Cladosporium, are found in very high numbers during hot and dry conditions. Generally, a late summer - fall peak is seen for common outdoor fungal spores. Rain will temporarily lower outdoor mold spore count, but counts rise rapidly when the rainy period ends. The most important indoor molds are Aspergillus and  Penicillium. Dark, humid and poorly ventilated basements are ideal sites for mold growth. The next most common sites of mold growth are the bathroom and the kitchen. Outdoor (Seasonal) Mold Control Use air conditioning and keep windows closed. Avoid exposure to decaying vegetation. Avoid leaf raking. Avoid grain handling. Consider wearing a face mask if working in moldy areas.  Indoor (Perennial) Mold Control  Maintain humidity below 50%. Get rid of mold growth on hard surfaces with water, detergent and, if necessary, 5% bleach (do not mix with other cleaners). Then dry the area completely. If mold covers an area more than 10 square feet, consider hiring an indoor environmental professional. For clothing, washing with soap and water is best. If moldy items cannot be cleaned and dried, throw them away. Remove sources e.g. contaminated carpets. Repair and seal leaking roofs or pipes. Using dehumidifiers in damp basements may be helpful, but empty the water and clean units regularly to prevent mildew from forming. All rooms, especially basements, bathrooms and kitchens, require ventilation and cleaning to deter mold and mildew growth. Avoid carpeting on concrete or damp floors, and storing items in damp areas.

## 2023-08-13 ENCOUNTER — Other Ambulatory Visit (HOSPITAL_COMMUNITY): Payer: Self-pay

## 2023-08-13 ENCOUNTER — Telehealth: Payer: Self-pay

## 2023-08-13 MED ORDER — AZELASTINE-FLUTICASONE 137-50 MCG/ACT NA SUSP: 1.0000 | Freq: Two times a day (BID) | NASAL | 5 refills | Status: DC

## 2023-08-13 NOTE — Telephone Encounter (Signed)
Start dymista (fluticasone + azelastine nasal spray combination) 1 spray per nostril twice a day. This replaces your other nasal sprays. If it's not covered let us know.  Nasal saline spray (i.e., Simply Saline) or nasal saline lavage (i.e., NeilMed) is recommended as needed and prior to medicated nasal sprays.  Rx sent in.

## 2023-08-13 NOTE — Telephone Encounter (Signed)
Hey pts insurance does not cover the [employer] prefers generic dymista, flonase, mometasone, flunisolide

## 2023-08-13 NOTE — Telephone Encounter (Signed)
Pharmacy Patient Advocate Encounter   Received notification from CoverMyMeds that prior authorization for Ryaltris [id number]/ACT suspension is required/requested.   Insurance verification completed.   The patient is insured through [facility] .   Prior Authorization form/request asks a question that requires your assistance. Please see the question below and advise accordingly. The PA will not be submitted until the necessary information is received.

## 2023-09-10 ENCOUNTER — Other Ambulatory Visit: Payer: Self-pay | Admitting: Allergy

## 2023-09-10 MED ORDER — FLUTICASONE PROPIONATE 50 MCG/ACT NA SUSP: 2.0000 | Freq: Every day | NASAL | 5 refills | Status: DC

## 2023-10-14 ENCOUNTER — Encounter: Payer: Self-pay | Admitting: Family Medicine

## 2023-10-14 ENCOUNTER — Ambulatory Visit (INDEPENDENT_AMBULATORY_CARE_PROVIDER_SITE_OTHER): Payer: 59 | Admitting: Family Medicine

## 2023-10-14 VITALS — BP 104/76 | HR 88 | Temp 98.3°F

## 2023-10-14 DIAGNOSIS — R053 Chronic cough: Secondary | ICD-10-CM | POA: Diagnosis not present

## 2023-10-14 DIAGNOSIS — J302 Other seasonal allergic rhinitis: Secondary | ICD-10-CM | POA: Diagnosis not present

## 2023-10-14 DIAGNOSIS — J3089 Other allergic rhinitis: Secondary | ICD-10-CM | POA: Diagnosis not present

## 2023-10-14 NOTE — Progress Notes (Signed)
 [address] [place] [address] Dept: [phone]  FOLLOW UP NOTE  Patient ID: Hunter Fischer, male    DOB: 12-11-1985  Age: 38 y.o. MRN: [id number] Date of Office Visit: 10/14/2023  Assessment  Chief Complaint: Follow-up (Is not sure if the antihistamine is really helping. )  HPI Hunter Fischer is a 38 year old male who presents to the clinic for a follow-up visit.  He was last seen in this clinic on 08/12/2023 by Dr. Burdette Carolin for evaluation of allergic rhinitis and chronic cough.    At today's visit, he reports his allergic rhinitis has been moderately well-controlled with nasal congestion and postnasal drainage as the main symptoms.  He does report occasional sneezing mostly occurring when he is at home and he reports frequent episodes of postnasal drainage especially while at work.  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  He continues Xyzal  as needed and uses Ryaltris  as needed.  He reports that he received a notice that his insurance would not cover Ryaltris  and the medication was broken into its separate components. He reports that he later received a call from the specialty pharmacy notifying him that his Ryaltris  was ready for pickup or mail out. He reports that he will likely use the Flonase  and azelastine  and then he will switch over to Ryaltris .  His last environmental allergy  skin testing was on 08/22/2022 and was positive to grass pollen, weed pollen, ragweed pollen, mold, cockroach, and dust mite.  He is not interested in allergen immunotherapy at this time.  Cough is reported as moderately well controlled with thick mucus production especially on days when he is at work at [employer].  He reports some improvement in while taking Xyzal  and using Ryaltris .  He is not currently using nasal saline rinses.  His current medications are listed in the chart.  Drug Allergies:  Allergies  Allergen Reactions   Strattera  [Atomoxetine]     Physical Exam: BP 104/76   Pulse 88   Temp 98.3 F (36.8 C)   SpO2 97%    Physical Exam Vitals reviewed.  Constitutional:      Appearance: Normal appearance.  HENT:     Head: Normocephalic and atraumatic.     Right Ear: Tympanic membrane normal.     Left Ear: Tympanic membrane normal.     Nose:     Comments: Bilateral nares normal with thin clear drainage noted.  Normal.  Ears normal.  Eyes normal.    Mouth/Throat:     Pharynx: Oropharynx is clear.  Eyes:     Conjunctiva/sclera: Conjunctivae normal.  Cardiovascular:     Rate and Rhythm: Normal rate and regular rhythm.     Heart sounds: Normal heart sounds. No murmur heard. Pulmonary:     Effort: Pulmonary effort is normal.     Breath sounds: Normal breath sounds.     Comments: Lungs clear to auscultation Musculoskeletal:        General: Normal range of motion.     Cervical back: Normal range of motion and neck supple.  Skin:    General: Skin is warm and dry.  Neurological:     Mental Status: He is alert and oriented to person, place, and time.  Psychiatric:        Mood and Affect: Mood normal.        Behavior: Behavior normal.        Thought Content: Thought content normal.  Judgment: Judgment normal.     Assessment and Plan: 1. Seasonal and perennial allergic rhinitis   2. Chronic coughing     Patient Instructions  Allergic rhinitis Continue allergen avoidance measures directed toward grass pollen, weed pollen, ragweed pollen, mold, dust mite, and cockroach as listed below Continue Xyzal  5 mg once a day if needed for runny nose or itch Continue Ryaltris  2 sprays in each nostril up to twice a day if needed for nasal symptoms Consider saline nasal rinses as needed for nasal symptoms. Use this before any medicated nasal sprays for best result Consider saline nasal gel if needed for dry nostrils Consider allergen immunotherapy if your symptoms are not well-controlled with this treatment  plan as listed above  Cough Continue to monitor symptoms  Call the clinic if this treatment plan is not working well for you.    Follow up in 6 months or sooner if needed.   Return in about 6 months (around 04/14/2024).    Thank you for the opportunity to care for this patient.  Please do not hesitate to contact me with questions.  Marinus Sic, FNP [facility] 

## 2023-10-14 NOTE — Patient Instructions (Signed)
 Allergic rhinitis Continue allergen avoidance measures directed toward grass pollen, weed pollen, ragweed pollen, mold, dust mite, and cockroach as listed below Continue Xyzal  5 mg once a day if needed for runny nose or itch Continue Ryaltris  2 sprays in each nostril up to twice a day if needed for nasal symptoms Consider saline nasal rinses as needed for nasal symptoms. Use this before any medicated nasal sprays for best result Consider saline nasal gel if needed for dry nostrils Consider allergen immunotherapy if your symptoms are not well-controlled with this treatment plan as listed above  Cough Continue to monitor symptoms  Call the clinic if this treatment plan is not working well for you.    Follow up in 6 months or sooner if needed.   Reducing Pollen Exposure The American Academy of Allergy , Asthma and Immunology suggests the following steps to reduce your exposure to pollen during allergy  seasons. Do not hang sheets or clothing out to dry; pollen may collect on these items. Do not mow lawns or spend time around freshly cut grass; mowing stirs up pollen. Keep windows closed at night.  Keep car windows closed while driving. Minimize morning activities outdoors, a time when pollen counts are usually at their highest. Stay indoors as much as possible when pollen counts or humidity is high and on windy days when pollen tends to remain in the air longer. Use air conditioning when possible.  Many air conditioners have filters that trap the pollen spores. Use a HEPA room air filter to remove pollen form the indoor air you breathe.  Control of Mold Allergen Mold and fungi can grow on a variety of surfaces provided certain temperature and moisture conditions exist.  Outdoor molds grow on plants, decaying vegetation and soil.  The major outdoor mold, Alternaria and Cladosporium, are found in very high numbers during hot and dry conditions.  Generally, a late Summer - Fall peak is seen for  common outdoor fungal spores.  Rain will temporarily lower outdoor mold spore count, but counts rise rapidly when the rainy period ends.  The most important indoor molds are Aspergillus and Penicillium.  Dark, humid and poorly ventilated basements are ideal sites for mold growth.  The next most common sites of mold growth are the bathroom and the kitchen.  Outdoor [employer] Use air conditioning and keep windows closed Avoid exposure to decaying vegetation. Avoid leaf raking. Avoid grain handling. Consider wearing a face mask if working in moldy areas.  Indoor Mold Control Maintain humidity below 50%. Clean washable surfaces with 5% bleach solution. Remove sources e.g. Contaminated carpets.   Control of Dust Mite Allergen Dust mites play a major role in allergic asthma and rhinitis. They occur in environments with high humidity wherever human skin is found. Dust mites absorb humidity from the atmosphere (ie, they do not drink) and feed on organic matter (including shed human and animal skin). Dust mites are a microscopic type of insect that you cannot see with the naked eye. High levels of dust mites have been detected from mattresses, pillows, carpets, upholstered furniture, bed covers, clothes, soft toys and any woven material. The principal allergen of the dust mite is found in its feces. A gram of dust may contain 1,000 mites and 250,000 fecal particles. Mite antigen is easily measured in the air during house cleaning activities. Dust mites do not bite and do not cause harm to humans, other than by triggering allergies/asthma.  Ways to decrease your exposure to dust mites in your home:  1. Encase mattresses, box springs and pillows with a mite-impermeable barrier or cover  2. Wash sheets, blankets and drapes weekly in hot water (130 F) with detergent and dry them in a dryer on the hot setting.  3. Have the room cleaned frequently with a vacuum cleaner and a damp dust-mop. For carpeting  or rugs, vacuuming with a vacuum cleaner equipped with a high-efficiency particulate air (HEPA) filter. The dust mite allergic individual should not be in a room which is being cleaned and should wait 1 hour after cleaning before going into the room.  4. Do not sleep on upholstered furniture (eg, couches).  5. If possible removing carpeting, upholstered furniture and drapery from the home is ideal. Horizontal blinds should be eliminated in the rooms where the person spends the most time (bedroom, study, television room). Washable vinyl, roller-type shades are optimal.  6. Remove all non-washable stuffed toys from the bedroom. Wash stuffed toys weekly like sheets and blankets above.  7. Reduce indoor humidity to less than 50%. Inexpensive humidity monitors can be purchased at most hardware stores. Do not use a humidifier as can make the problem worse and are not recommended.  Control of Cockroach Allergen Cockroach allergen has been identified as an important cause of acute attacks of asthma, especially in urban settings.  There are fifty-five species of cockroach that exist in the [place] , however only three, the [place], [place] and [place] species produce allergen that can affect patients with Asthma.  Allergens can be obtained from fecal particles, egg casings and secretions from cockroaches.    Remove food sources. Reduce access to water. Seal access and entry points. Spray runways with 0.5-1% Diazinon or Chlorpyrifos Blow boric acid power under stoves and refrigerator. Place bait stations (hydramethylnon) at feeding sites.

## 2024-02-03 ENCOUNTER — Other Ambulatory Visit: Payer: Self-pay | Admitting: Allergy

## 2024-04-15 ENCOUNTER — Encounter: Payer: Self-pay | Admitting: Allergy

## 2024-04-15 ENCOUNTER — Ambulatory Visit (INDEPENDENT_AMBULATORY_CARE_PROVIDER_SITE_OTHER): Admitting: Allergy

## 2024-04-15 ENCOUNTER — Other Ambulatory Visit: Payer: Self-pay

## 2024-04-15 ENCOUNTER — Telehealth: Payer: Self-pay | Admitting: Allergy

## 2024-04-15 VITALS — BP 118/84 | HR 85 | Temp 99.1°F | Resp 16 | Ht 67.0 in | Wt 189.0 lb

## 2024-04-15 DIAGNOSIS — R21 Rash and other nonspecific skin eruption: Secondary | ICD-10-CM | POA: Diagnosis not present

## 2024-04-15 DIAGNOSIS — R09A2 Foreign body sensation, throat: Secondary | ICD-10-CM

## 2024-04-15 DIAGNOSIS — J3089 Other allergic rhinitis: Secondary | ICD-10-CM

## 2024-04-15 DIAGNOSIS — J302 Other seasonal allergic rhinitis: Secondary | ICD-10-CM

## 2024-04-15 DIAGNOSIS — R053 Chronic cough: Secondary | ICD-10-CM | POA: Diagnosis not present

## 2024-04-15 MED ORDER — LEVOCETIRIZINE DIHYDROCHLORIDE 5 MG PO TABS: 5.0000 mg | ORAL_TABLET | Freq: Every evening | ORAL | 5 refills | Status: AC

## 2024-04-15 NOTE — Telephone Encounter (Signed)
 Please refer to ENT - chronic coughing, globus sensation, nasal congestion.

## 2024-04-15 NOTE — Progress Notes (Unsigned)
 Follow Up Note  RE: Hunter Fischer MRN: [id number] DOB: Apr 21, 1986 Date of Office Visit: 04/15/2024  Referring provider: [facility] Primary care provider: [facility] Complaint: Follow-up (He states he has some episodes of nose bleeding, it is on and off because of air dried or activities. He is not taking nasal spray because he thinks it exacerbates nose bleeding ) and Allergy  Rhinitis (Is a littte bit better)  History of Present Illness: I had the pleasure of seeing Hunter Fischer for a follow up visit at the [facility] on 04/15/2024. He is a 38 y.o. male, who is being followed for allergic rhinitis and cough. His previous allergy  office visit was on 10/14/2023 with Arlean Mutter, FNP. Today is a regular follow up visit.  Discussed the use of AI scribe software for clinical note transcription with the patient, who gave verbal consent to proceed.  History of Present Illness   He experiences recurrent epistaxis, particularly during dry and cold seasons. He uses Bryaltris nasal spray, though not regularly due to forgetfulness. Initially, the spray provided relief with a 'draining sensation,' but its effectiveness has diminished over time. He has not yet used a sinus rinse bottle that was provided to him. He takes Xyzal  daily for allergies but finds it difficult to assess its effectiveness. He works part-time in a Education administrator. He showers after work to mitigate the effects of the dust.  He has a chronic cough that has persisted for over a year, often leading to gagging and occasional vomiting. He describes a sensation of a 'lump of some junk' in the back of his throat, which he attempts to dislodge by coughing. The cough is sometimes productive but mostly not. No symptoms of heartburn or reflux.  He mentions a possible deviated septum, as he often experiences one nostril being more open than the other, leading to  significant drainage when both are open.  He attempted to self-treat a suspected wart on his left hand using an acid treatment, which resulted in a burn but no resolution of the lesion. The lesion is described as a small, non-painful, flat spot that has been present for a long time.        ***  Assessment and Plan: Hunter Fischer is a 38 y.o. male with:  Seasonal and perennial allergic rhinitis Past history - 2024 skin testing positive to grass, ragweed, weed, mold, cockroach and dust mites. No prior ENT evaluation.   Interim history - Reports of nasal congestion and drainage, previously responded well to Ryaltris  nasal spray. Currently taking an unidentified over-the-counter allergy  medication with minimal relief. Works in a Education administrator which may be contributing to symptoms. Continue environmental control measures as below. Take Xyzal  (levocetirizine) daily at night.  Start Ryaltris  (olopatadine + mometasone nasal spray combination) 1-2 sprays per nostril twice a day. Sample given. This replaces your other nasal sprays. If this works well for you, then have pharmacy ship the medication to your home - prescription already sent in.  Nasal saline spray (i.e., Simply Saline) or nasal saline lavage (i.e., NeilMed) is recommended as needed and prior to medicated nasal sprays. Try saline lavage after coming home from work.  Consider allergy  injections for long term control if above medications do not help the symptoms - handout given.  2 injections.    Chronic coughing Past history - Coughing x few years. No prior asthma diagnosis or inhaler use. No recent CXR.  Coughing more at home but also at work - [employer]. 2024 spirometry normal. Interim history - Reports of coughing and production of thick mucus, particularly at work. No associated wheezing, shortness of breath, or chest tightness. No current use of inhaler. Declined spirometry today. Offered CXR due to persistent symptoms  - declined today. Monitor symptoms. Allergic rhinitis Continue allergen avoidance measures directed toward grass pollen, weed pollen, ragweed pollen, mold, dust mite, and cockroach as listed below Continue Xyzal  5 mg once a day if needed for runny nose or itch Continue Ryaltris  2 sprays in each nostril up to twice a day if needed for nasal symptoms Consider saline nasal rinses as needed for nasal symptoms. Use this before any medicated nasal sprays for best result Consider saline nasal gel if needed for dry nostrils Consider allergen immunotherapy if your symptoms are not well-controlled with this treatment plan as listed above   Cough Continue to monitor symptoms Assessment and Plan    Chronic cough with nasal congestion and drainage Chronic cough with sensation of lump in throat for over a year. Possible causes include post-nasal drainage due to allergies, reflux, or anatomical obstruction. Large uvula may contribute to symptoms. Cough sometimes productive, leading to gag reflex and occasional vomiting. - Refer to ENT for evaluation of chronic cough and sensation of lump in throat. - Advise increased frequency of nasal spray use to manage drainage. - Discuss potential causes of cough including drainage, reflux, and anatomical issues.  Allergic rhinitis with recurrent epistaxis Allergic rhinitis with recurrent epistaxis, worsened by dry and cold weather. Nasal spray used infrequently, initial symptom relief noted. Improper nasal spray technique may contribute to epistaxis. - Educated on proper nasal spray technique to avoid irritation and epistaxis. - Recommend saline gel or spray to moisturize nasal passages. - Encourage use of sinus rinses in dusty environments.  Lesion of left hand, possible wart or callus Lesion on left hand, possibly wart or callus, present long-term. Self-treatment with acid-based product caused burn without resolution. Lesion is not painful, primarily cosmetic  concern. - Monitor lesion, consider dermatology referral for cosmetic reasons if desired.         Return in about 3 months (around 07/16/2024).  Meds ordered this encounter  Medications   levocetirizine (XYZAL ) 5 MG tablet    Sig: Take 1 tablet (5 mg total) by mouth every evening.    Dispense:  30 tablet    Refill:  5   Lab Orders  No laboratory test(s) ordered today    Diagnostics: Spirometry:  Tracings reviewed. His effort: {Blank single:19197::Good reproducible efforts.,It was hard to get consistent efforts and there is a question as to whether this reflects a maximal maneuver.,Poor effort, data can not be interpreted.} FVC: ***L FEV1: ***L, ***% predicted FEV1/FVC ratio: ***% Interpretation: {Blank single:19197::Spirometry consistent with mild obstructive disease,Spirometry consistent with moderate obstructive disease,Spirometry consistent with severe obstructive disease,Spirometry consistent with possible restrictive disease,Spirometry consistent with mixed obstructive and restrictive disease,Spirometry uninterpretable due to technique,Spirometry consistent with normal pattern,No overt abnormalities noted given today's efforts}.  Please see scanned spirometry results for details.  Skin Testing: {Blank single:19197::Select foods,Environmental allergy  panel,Environmental allergy  panel and select foods,Food allergy  panel,None,Deferred due to recent antihistamines use}. *** Results discussed with patient/family.   Medication List:  Current Outpatient Medications  Medication Sig Dispense Refill   amphetamine-dextroamphetamine (ADDERALL) 20 MG tablet Take 20 mg by mouth daily.     cholecalciferol (VITAMIN D3) 25 MCG (1000 UNIT) tablet Take 1,000 Units by mouth daily.  levocetirizine (XYZAL ) 5 MG tablet Take 1 tablet (5 mg total) by mouth every evening. 30 tablet 5   sertraline (ZOLOFT) 50 MG tablet Take 50 mg by mouth daily.     Azelastine   HCl 137 MCG/SPRAY SOLN Place 2 sprays into both nostrils daily. (Patient not taking: Reported on 04/15/2024) 30 mL 5   fluticasone  (FLONASE ) 50 MCG/ACT nasal spray Place 2 sprays into both nostrils daily. (Patient not taking: Reported on 04/15/2024) 16 mL 5   gabapentin (NEURONTIN) 100 MG capsule Take 100 mg by mouth at bedtime. (Patient not taking: Reported on 04/15/2024)     No current facility-administered medications for this visit.   Allergies: Allergies  Allergen Reactions   Strattera [Atomoxetine]    I reviewed his past medical history, social history, family history, and environmental history and no significant changes have been reported from his previous visit.  Review of Systems  Constitutional:  Negative for appetite change, chills, fever and unexpected weight change.  HENT:  Positive for congestion, postnasal drip and rhinorrhea.   Eyes:  Negative for itching.  Respiratory:  Positive for cough. Negative for chest tightness, shortness of breath and wheezing.   Cardiovascular:  Negative for chest pain.  Gastrointestinal:  Negative for abdominal pain.  Genitourinary:  Negative for difficulty urinating.  Skin:  Positive for rash.  Allergic/Immunologic: Positive for environmental allergies. Negative for food allergies.  Neurological:  Negative for headaches.    Objective: BP 118/84 (BP Location: Left Arm, Patient Position: Sitting, Cuff Size: Normal)   Pulse 85   Temp 99.1 F (37.3 C) (Temporal)   Resp 16   Ht 5' 7 (1.702 m)   Wt 189 lb (85.7 kg)   SpO2 97%   BMI 29.60 kg/m  Body mass index is 29.6 kg/m. Physical Exam Vitals and nursing note reviewed.  Constitutional:      Appearance: Normal appearance. He is well-developed.  HENT:     Head: Normocephalic and atraumatic.     Right Ear: Tympanic membrane and external ear normal.     Left Ear: Tympanic membrane and external ear normal.     Nose: Congestion (on right side) present.     Mouth/Throat:     Mouth:  Mucous membranes are moist.     Pharynx: Oropharynx is clear.  Eyes:     Conjunctiva/sclera: Conjunctivae normal.  Cardiovascular:     Rate and Rhythm: Normal rate and regular rhythm.     Heart sounds: Normal heart sounds. No murmur heard.    No friction rub. No gallop.  Pulmonary:     Effort: Pulmonary effort is normal.     Breath sounds: Normal breath sounds. No wheezing, rhonchi or rales.  Musculoskeletal:     Cervical back: Neck supple.  Skin:    General: Skin is warm.     Findings: Rash present.     Comments: On left palm - quarter size erythema.  Neurological:     Mental Status: He is alert and oriented to person, place, and time.  Psychiatric:        Behavior: Behavior normal.    Previous notes and tests were reviewed. The plan was reviewed with the patient/family, and all questions/concerned were addressed.  It was my pleasure to see Hunter Fischer today and participate in his care. Please feel free to contact me with any questions or concerns.  Sincerely,  Orlan Cramp, DO Allergy  & Immunology  [facility]  [place] office: [phone] [facility] office: [phone]

## 2024-04-15 NOTE — Patient Instructions (Addendum)
 Environmental allergies 2024 skin testing positive to grass, ragweed, weed, mold, cockroach and dust mites. Continue environmental control measures as below. Take Xyzal  (levocetirizine) daily at night.  May use Ryaltris  (olopatadine + mometasone nasal spray combination) 1-2 sprays per nostril twice a day.  This replaces your other nasal sprays. If this works well for you, then have pharmacy ship the medication to your home - prescription already sent in.  Nasal saline spray (i.e., Simply Saline) or nasal saline lavage (i.e., NeilMed) is recommended as needed and prior to medicated nasal sprays. Try saline lavage after coming home from work.  Consider allergy  injections for long term control if above medications do not help the symptoms.  Chronic coughing Globus sensation  Monitor symptoms. Refer to ENT   Hand rash Monitor symptoms. Consider dermatology evaluation next.   Follow up in 2-3 months or sooner if needed.   Buffered Isotonic Saline Irrigations:  Goal: When you irrigate with the isotonic saline (salt water) it washes mucous and other debris from your nose that could be contributing to your nasal symptoms.   Recipe: Obtain 1 quart jar that is clean Fill with clean (bottled, boiled or distilled) water Add 1-2 heaping teaspoons of salt without iodine If the solution with 2 teaspoons of salt is too strong, adjust the amount down until better tolerated Add 1 teaspoon of [employer] baking soda (pure bicarbonate) Mix ingredients together and store at room temperature and discard after 1 week * Alternatively you can buy pre made salt packets for the NeilMed bottle or there          are other over the counter brands available  Instructions: Warm  cup of the solution in the microwave if desired but be careful not to overheat as this will burn the inside of your nose Stand over a sink (or do it while you shower) and squirt the solution into one side of your nose aiming towards  the back of your head Sometimes saying "coca cola" while irrigating can be helpful to prevent fluid from going down your throat  The solution will travel to the back of your nose and then come out the other side Perform this again on the other side Try to do this twice a day If you are using a nasal spray in addition to the irrigation, irrigate first and then use the topical nasal spray otherwise you will wash the nasal spray out of your nose   Reducing Pollen Exposure Pollen seasons: trees (spring), grass (summer) and ragweed/weeds (fall). Keep windows closed in your home and car to lower pollen exposure.  Install air conditioning in the bedroom and throughout the house if possible.  Avoid going out in dry windy days - especially early morning. Pollen counts are highest between 5 - 10 AM and on dry, hot and windy days.  Save outside activities for late afternoon or after a heavy rain, when pollen levels are lower.  Avoid mowing of grass if you have grass pollen allergy . Be aware that pollen can also be transported indoors on people and pets.  Dry your clothes in an automatic dryer rather than hanging them outside where they might collect pollen.  Rinse hair and eyes before bedtime. Control of House Dust Mite Allergen Dust mite allergens are a common trigger of allergy  and asthma symptoms. While they can be found throughout the house, these microscopic creatures thrive in warm, humid environments such as bedding, upholstered furniture and carpeting. Because so much time is spent  in the bedroom, it is essential to reduce mite levels there.  Encase pillows, mattresses, and box springs in special allergen-proof fabric covers or airtight, zippered plastic covers.  Bedding should be washed weekly in hot water (130 F) and dried in a hot dryer. Allergen-proof covers are available for comforters and pillows that can't be regularly washed.  Wash the allergy -proof covers every few months. Minimize  clutter in the bedroom. Keep pets out of the bedroom.  Keep humidity less than 50% by using a dehumidifier or air conditioning. You can buy a humidity measuring device called a hygrometer to monitor this.  If possible, replace carpets with hardwood, linoleum, or washable area rugs. If that's not possible, vacuum frequently with a vacuum that has a HEPA filter. Remove all upholstered furniture and non-washable window drapes from the bedroom. Remove all non-washable stuffed toys from the bedroom.  Wash stuffed toys weekly. Cockroach Allergen Avoidance Cockroaches are often found in the homes of densely populated urban areas, schools or commercial buildings, but these creatures can lurk almost anywhere. This does not mean that you have a dirty house or living area. Block all areas where roaches can enter the home. This includes crevices, wall cracks and windows.  Cockroaches need water to survive, so fix and seal all leaky faucets and pipes. Have an exterminator go through the house when your family and pets are gone to eliminate any remaining roaches. Keep food in lidded containers and put pet food dishes away after your pets are done eating. Vacuum and sweep the floor after meals, and take out garbage and recyclables. Use lidded garbage containers in the kitchen. Wash dishes immediately after use and clean under stoves, refrigerators or toasters where crumbs can accumulate. Wipe off the stove and other kitchen surfaces and cupboards regularly. Mold Control Mold and fungi can grow on a variety of surfaces provided certain temperature and moisture conditions exist.  Outdoor molds grow on plants, decaying vegetation and soil. The major outdoor mold, Alternaria and Cladosporium, are found in very high numbers during hot and dry conditions. Generally, a late summer - fall peak is seen for common outdoor fungal spores. Rain will temporarily lower outdoor mold spore count, but counts rise rapidly when the rainy  period ends. The most important indoor molds are Aspergillus and Penicillium. Dark, humid and poorly ventilated basements are ideal sites for mold growth. The next most common sites of mold growth are the bathroom and the kitchen. Outdoor (Seasonal) Mold Control Use air conditioning and keep windows closed. Avoid exposure to decaying vegetation. Avoid leaf raking. Avoid grain handling. Consider wearing a face mask if working in moldy areas.  Indoor (Perennial) Mold Control  Maintain humidity below 50%. Get rid of mold growth on hard surfaces with water, detergent and, if necessary, 5% bleach (do not mix with other cleaners). Then dry the area completely. If mold covers an area more than 10 square feet, consider hiring an indoor environmental professional. For clothing, washing with soap and water is best. If moldy items cannot be cleaned and dried, throw them away. Remove sources e.g. contaminated carpets. Repair and seal leaking roofs or pipes. Using dehumidifiers in damp basements may be helpful, but empty the water and clean units regularly to prevent mildew from forming. All rooms, especially basements, bathrooms and kitchens, require ventilation and cleaning to deter mold and mildew growth. Avoid carpeting on concrete or damp floors, and storing items in damp areas.

## 2024-04-16 ENCOUNTER — Encounter: Payer: Self-pay | Admitting: Allergy

## 2024-04-27 ENCOUNTER — Encounter (INDEPENDENT_AMBULATORY_CARE_PROVIDER_SITE_OTHER): Payer: Self-pay | Admitting: Otolaryngology

## 2024-04-27 ENCOUNTER — Ambulatory Visit (INDEPENDENT_AMBULATORY_CARE_PROVIDER_SITE_OTHER): Payer: Self-pay | Admitting: Otolaryngology

## 2024-04-27 VITALS — BP 123/81 | HR 87 | Temp 99.0°F | Ht 67.0 in | Wt 188.0 lb

## 2024-04-27 DIAGNOSIS — J3489 Other specified disorders of nose and nasal sinuses: Secondary | ICD-10-CM

## 2024-04-27 DIAGNOSIS — J3089 Other allergic rhinitis: Secondary | ICD-10-CM

## 2024-04-27 DIAGNOSIS — K219 Gastro-esophageal reflux disease without esophagitis: Secondary | ICD-10-CM

## 2024-04-27 DIAGNOSIS — J329 Chronic sinusitis, unspecified: Secondary | ICD-10-CM

## 2024-04-27 MED ORDER — AMOXICILLIN-POT CLAVULANATE 875-125 MG PO TABS: 1.0000 | ORAL_TABLET | Freq: Two times a day (BID) | ORAL | 0 refills | Status: AC

## 2024-04-27 NOTE — Progress Notes (Signed)
 Reason for Consult:Cough Referring Physician: Dr Hunter Fischer is an 38 y.o. male.  HPI: History of a mucus feeling in the throat.  He has a lump feeling that is intermittent but fairly constant for many years.  It is unchanged.  He feels like sometimes it makes him feel like he needs to gag.  He has definite allergies Hunter has allergy  evaluation.  He is on a antihistamine/nasal steroid spray combo Hunter Fischer .  He has not had any recent antibiotics.  Sometimes he gets a yellow coloration to the drainage from his nose.  He is usually constantly congested of the nose.  He has not had a CT scan.  He also has an occasional cough.  It is nonproductive.  He has no dysphagia or odynophagia.  No hoarseness.  Past Medical History:  Diagnosis Date   ADHD (attention deficit hyperactivity disorder)    Anxiety     No past surgical history on file.  No family history on file.  Social History:  reports that he has never smoked. He does not have any smokeless tobacco history on file. He reports that he does not drink alcohol Hunter does not use drugs.  Allergies:  Allergies  Allergen Reactions   Strattera [Atomoxetine]     Medications: I have reviewed the patient's current medications.  No results found for this or any previous visit (from the past 48 hours).  No results found.  ROS There were no vitals taken for this visit. Physical Exam Constitutional:      Appearance: Normal appearance.  HENT:     Head: Normocephalic Hunter atraumatic.     Right Ear: Tympanic membrane is without lesions Hunter middle ear aerated, ear canal Hunter external ear normal.     Left Ear: Tympanic membrane is without lesions Hunter middle ear aerated, ear canal Hunter external ear normal.     Nose: Nose normal.  Septum looks to be midline with just a slight deviation, turbinates with moderate hypertrophy, No significant swelling or masses.     Oral cavity/oropharynx: Mucous membranes are moist. No lesions or masses     Larynx: normal voice. Mirror with no evidence of any lesions.    Eyes:     Extraocular Movements: Extraocular movements intact.     Conjunctiva/sclera: Conjunctivae normal.     Pupils: Pupils are equal, round, Hunter reactive to light.  Cardiovascular:     Rate Hunter Rhythm: Normal rate.  Pulmonary:     Effort: Pulmonary effort is normal.  Musculoskeletal:     Cervical back: Normal range of motion Hunter neck supple. No rigidity.  Lymphadenopathy:     Cervical: No cervical adenopathy or masses.salivary glands without lesions. .  Neurological:     Mental Status: He is alert. CN 2-12 intact. No nystagmus      Assessment/Plan: Chronic sinusitis-this may be a factor in his symptoms.  We talked about it is not a certainty but will try Augmentin for 14 days first.  Then a CT scan can be considered to rule out chronic sinusitis as a source of this longstanding issue.  He will continue his Fischer  Hunter nasal steroid spray.  Laryngal pharyngeal reflux-this is also a possibility of his symptoms.  I think that we will try the above treatment first Hunter if he is not having improvement Hunter there is no findings then we will proceed with a twice daily proton pump inhibitor.  He is going to follow-up in about 3 weeks to rediscuss how he  is doing after the Augmentin.  When he returns if he still has this sensation then we will proceed with a fiberoptic exam.  Norleen Notice 04/27/2024, 8:58 AM

## 2024-04-27 NOTE — Telephone Encounter (Signed)
 Lois was seen on 11/4 at 9:00 am by Dr. Roark.

## 2024-05-18 ENCOUNTER — Encounter (INDEPENDENT_AMBULATORY_CARE_PROVIDER_SITE_OTHER): Payer: Self-pay | Admitting: Otolaryngology

## 2024-05-18 ENCOUNTER — Ambulatory Visit (INDEPENDENT_AMBULATORY_CARE_PROVIDER_SITE_OTHER): Admitting: Otolaryngology

## 2024-05-18 VITALS — BP 116/71 | HR 78 | Temp 97.7°F

## 2024-05-18 DIAGNOSIS — K219 Gastro-esophageal reflux disease without esophagitis: Secondary | ICD-10-CM | POA: Diagnosis not present

## 2024-05-18 DIAGNOSIS — R059 Cough, unspecified: Secondary | ICD-10-CM | POA: Diagnosis not present

## 2024-05-18 MED ORDER — PANTOPRAZOLE SODIUM 40 MG PO TBEC: 40.0000 mg | DELAYED_RELEASE_TABLET | Freq: Two times a day (BID) | ORAL | 1 refills | Status: AC

## 2024-05-18 NOTE — Progress Notes (Signed)
 Reason for Consult: Cough Referring Physician: Dr. Gordon Ned Fabion Hunter Fischer is an 38 y.o. male.  HPI: He is here for reevaluation of his cough.  He feels like the reflux has gotten much worse.  The Augmentin  did not make much of a difference.  He has come to the conclusion that he doubts the sinus is the issue.  Reflux is now giving him heartburn indigestion.  He has a gag reflex when he coughs.  He is here to discuss.  Past Medical History:  Diagnosis Date   ADHD (attention deficit hyperactivity disorder)    Anxiety     History reviewed. No pertinent surgical history.  History reviewed. No pertinent family history.  Social History:  reports that he has never smoked. He does not have any smokeless tobacco history on file. He reports that he does not drink alcohol and does not use drugs.  Allergies:  Allergies  Allergen Reactions   Strattera [Atomoxetine]     Medications: I have reviewed the patient's current medications.  No results found for this or any previous visit (from the past 48 hours).  No results found.  ROS Blood pressure 116/71, pulse 78, temperature 97.7 F (36.5 C), SpO2 97%. Physical Exam Constitutional:      Appearance: Normal appearance.  HENT:     Head: Normocephalic and atraumatic.     Right Ear: Tympanic membrane is without lesions and middle ear aerated, ear canal and external ear normal.     Left Ear: Tympanic membrane is without lesions and middle ear aerated, ear canal and external ear normal.     Nose: Nose normal. Turbinates with mild hypertrophy, No significant swelling or masses.     Oral cavity/oropharynx: Mucous membranes are moist. No lesions or masses    Larynx: normal voice. Mirror attempted without success    Eyes:     Extraocular Movements: Extraocular movements intact.     Conjunctiva/sclera: Conjunctivae normal.     Pupils: Pupils are equal, round, and reactive to light.  Cardiovascular:     Rate and Rhythm: Normal rate.   Pulmonary:     Effort: Pulmonary effort is normal.  Musculoskeletal:     Cervical back: Normal range of motion and neck supple. No rigidity.  Lymphadenopathy:     Cervical: No cervical adenopathy or masses.salivary glands without lesions. .  Neurological:     Mental Status: He is alert. CN 2-12 intact. No nystagmus      Assessment/Plan: Cough/LPR-will switch to Protonix  and he will follow-up in 1 month.  We discussed the fiberoptic exam but he does not want to proceed with that today.  He likes treating first and then we will discuss the fiberoptic when he returns in 1 month.  We talked about reflux precautions.  Norleen Notice 05/18/2024, 8:25 AM

## 2024-06-08 ENCOUNTER — Ambulatory Visit (INDEPENDENT_AMBULATORY_CARE_PROVIDER_SITE_OTHER): Admitting: Otolaryngology
